# Patient Record
Sex: Male | Born: 1960 | Race: Black or African American | Hispanic: No | Marital: Single | Smoking: Never smoker
Health system: Southern US, Community
[De-identification: ages and names within clinical notes are randomized; demographics above are authoritative.]

---

## 2010-11-27 ENCOUNTER — Inpatient Hospital Stay (HOSPITAL_COMMUNITY)
Admission: EM | Admit: 2010-11-27 | Discharge: 2010-11-28 | Payer: Self-pay | Source: Home / Self Care | Attending: Urology | Admitting: Urology

## 2010-11-27 LAB — DIFFERENTIAL
Basophils Absolute: 0 10*3/uL (ref 0.0–0.1)
Basophils Relative: 0 % (ref 0–1)
Eosinophils Absolute: 0.1 10*3/uL (ref 0.0–0.7)
Eosinophils Relative: 1 % (ref 0–5)
Neutrophils Relative %: 57 % (ref 43–77)

## 2010-11-27 LAB — URINALYSIS, ROUTINE W REFLEX MICROSCOPIC
Nitrite: NEGATIVE
Urobilinogen, UA: 4 mg/dL — ABNORMAL HIGH (ref 0.0–1.0)
pH: 7 (ref 5.0–8.0)

## 2010-11-27 LAB — POCT I-STAT, CHEM 8
Calcium, Ion: 1.12 mmol/L (ref 1.12–1.32)
Chloride: 104 mEq/L (ref 96–112)
Glucose, Bld: 95 mg/dL (ref 70–99)
HCT: 41 % (ref 39.0–52.0)
Hemoglobin: 13.9 g/dL (ref 13.0–17.0)
TCO2: 32 mmol/L (ref 0–100)

## 2010-11-27 LAB — CBC
Platelets: 210 10*3/uL (ref 150–400)
RBC: 4.43 MIL/uL (ref 4.22–5.81)
RDW: 13.5 % (ref 11.5–15.5)
WBC: 9.2 10*3/uL (ref 4.0–10.5)

## 2010-12-20 NOTE — Op Note (Signed)
Seth Townsend, Seth Townsend               ACCOUNT NO.:  [id number]  MEDICAL RECORD NO.:  [id number]          PATIENT TYPE:  INP  LOCATION:  0981                         FACILITY:  [facility]  PHYSICIAN:  Martina Sinner, MD DATE OF BIRTH:  04-20-1961  DATE OF PROCEDURE:  11/27/2010 DATE OF DISCHARGE:                              OPERATIVE REPORT   PREOPERATIVE DIAGNOSIS:  Torsion left testicle.  POSTOPERATIVE DIAGNOSIS:  Torsion left testicle.  SURGERY:  Scrotal exploration; left orchiectomy; right orchiopexy.  Seth Townsend is a 50 year old gentleman who has had pain and swelling of the left hemiscrotum since Wednesday.  Unfortunately, the clinical diagnosis was confirmed with Doppler scrotal ultrasound with no flow to the left testicle and with torsion.  The patient was consented to the above procedure.  Preoperative antibiotics were given.  He had a lot of swelling on the left and the midline incision along the raphe vertically had to be extended to approximately 6 cm.  With good tension on the testicle, I dissected through the thickened, edematous dartos.  A lot of cautery was utilized to control bleeding at each layer.  I delivered an inflamed dark sac and then went a little bit deeper and opened the obvious very thickened tunica vaginalis delivering the testicle that was black and torsed at least 360 degrees.  The tunica vaginalis was impressively thickened opened widely and I resected some of it.  I left the testicle in anatomic position in a warm moist lap pad where I did a right orchiopexy.  Before I did this, hemostasis was excellent on the patient's left side.  Because the incision was quite long and his right testicle was not that large relative to his large scrotum, I was careful to keep the testicle in good anatomic position, again going through dartos layers and the tunica delivering the testicle.  Throughout the orchiopexy, I always kept the testicle with the head of the  epididymis in normal anatomic position.  The testicle was healthy.  I easily opened up the tunica vaginalis widely and made a deep scrotal pouch identifying the thickened midline raphe and the dartos muscle and fascia inferiorly and laterally. With 3 plain sutures, I placed a 4-0 silk on SH applying a hemostat to each suture.  I then dropped the testicle deep in the hemiscrotum in non- tensioned manner, placed the suture to the appropriate area of the testicle at 3, 6, and 12 o'clock.  The 3 o'clock suture was also through the thickened raphe.  I went through the tunica albuginea but not deep into the seminiferous tubules.  Right-sided scrotum was irrigated and the hemostasis was excellent and the testicular lie was excellent.  At this point in time, I directed my attention back to left testicle which was obviously black and necrotic.  I freed up the cord with cautery.  The cord itself was not that thick once I freed up and I could place the hemostat through it, dividing it in 2.  I double hemostat it and removed the testicle with cautery.  Each half of the cord was first suture-ligated with 0 Vicryl followed by two additional  free ties of 0 Vicryl removing the clamp.  There was mild retraction of the stump and no bleeding.  Hemostasis was excellent after irrigation.  I rechecked the right side of the testicle, testicular lie was excellent.  I then closed the thickened dartos from top to bottom with an Allis clamp at each apex.  I picked up dartos, then the midline raphe, and the contralateral side dartos and ran the suture.  I then did an interrupted 3-0 chromic closure trying not the invert his thickened skin.  Hemostasis was excellent.  Telfa fluff dressings and mesh pants was applied.  The patient was covered with antibiotics and taken to the recovery room.  The specimen was sent.          ______________________________ Martina Sinner, MD     SAM/MEDQ  D:  11/27/2010   T:  11/27/2010  Job:  [id number]  Electronically Signed by Alfredo Martinez MD on 12/20/2010 05:16:05 PM

## 2011-06-06 IMAGING — US US ART/VEN ABD/PELV/SCROTUM DOPPLER LTD
1 series · 13 of 25 positions shown · non-contrast
Comparison: None

CLINICAL DATA: Left testicular pain and swelling for 3 days.

SCROTAL ULTRASOUND
DOPPLER ULTRASOUND OF THE TESTICLES
TECHNIQUE: Complete ultrasound examination of the testicles,
epididymis, and other scrotal structures was performed.  Color and
spectral Doppler ultrasound were also utilized to evaluate blood
flow to the testicles.

[Series 1: us art/ven abd/pelv/scrotum doppler ltd · 0.07mm/px · 13 of 72 slices shown]
[im 1/72]
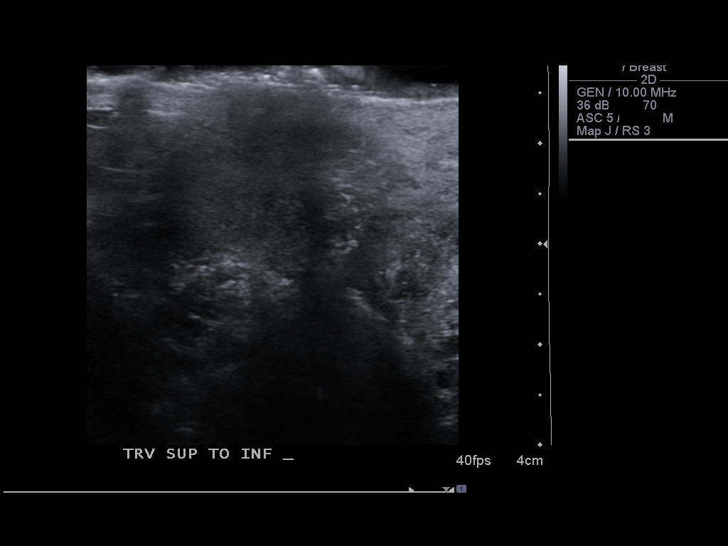
[im 6/72]
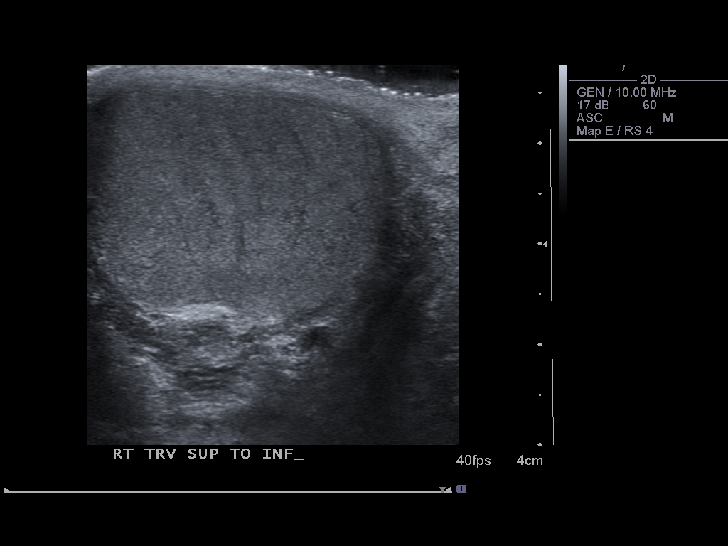
[im 12/72]
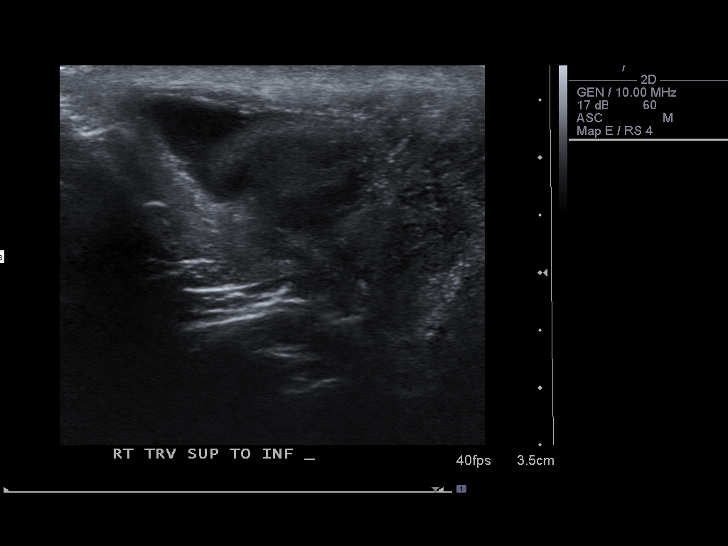
[im 18/72]
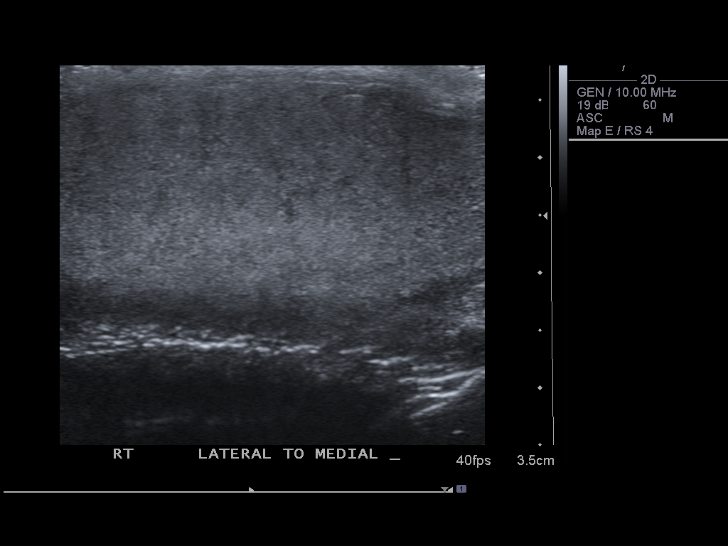
[im 24/72]
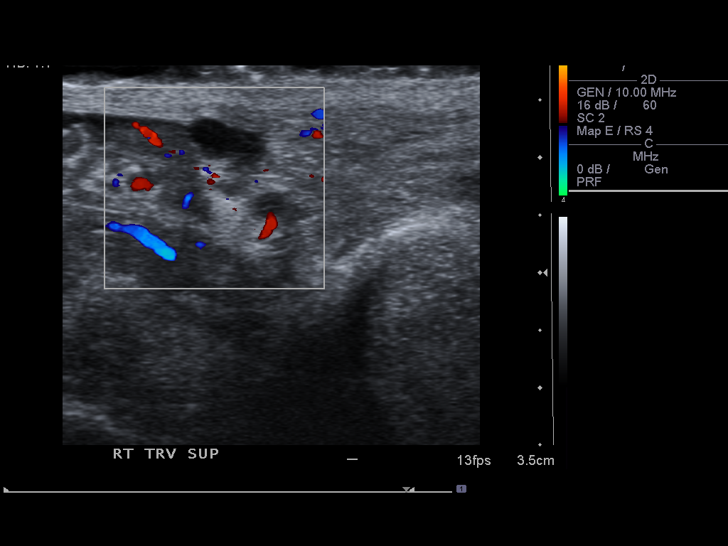
[im 30/72]
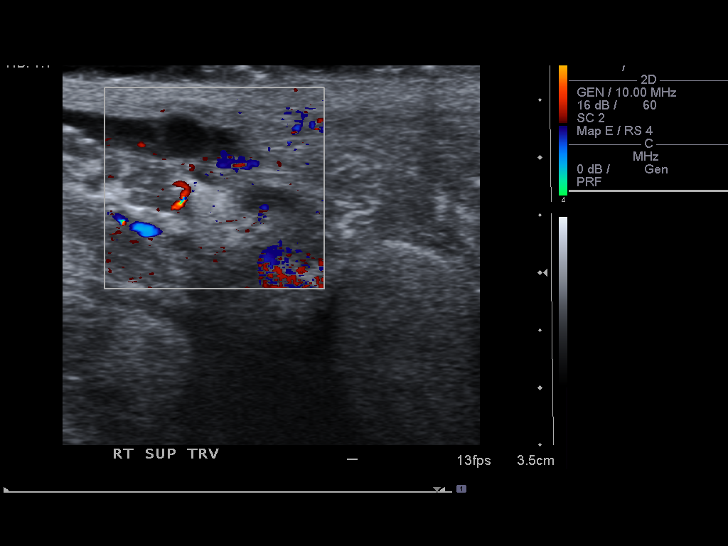
[im 36/72]
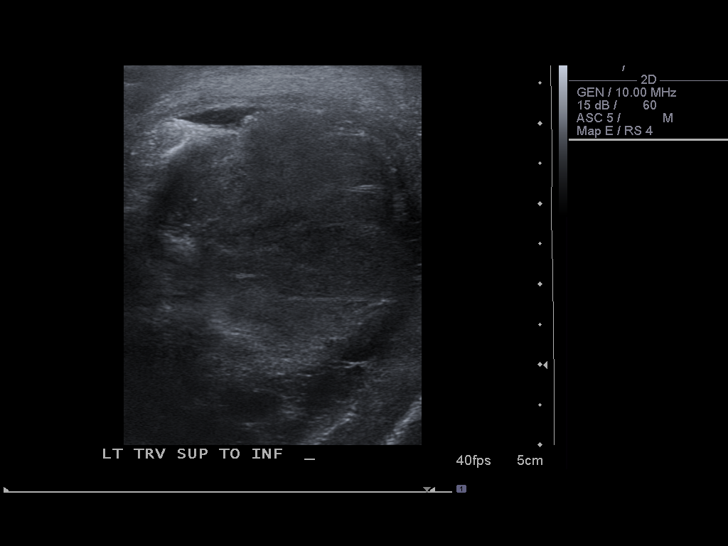
[im 42/72]
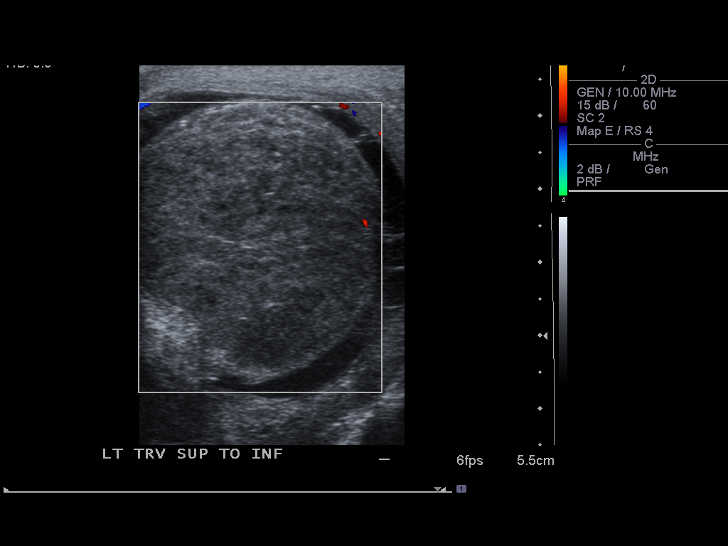
[im 48/72]
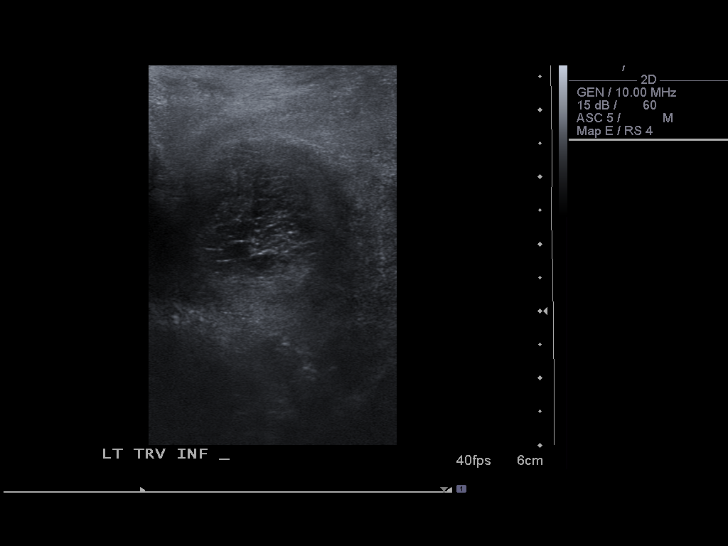
[im 54/72]
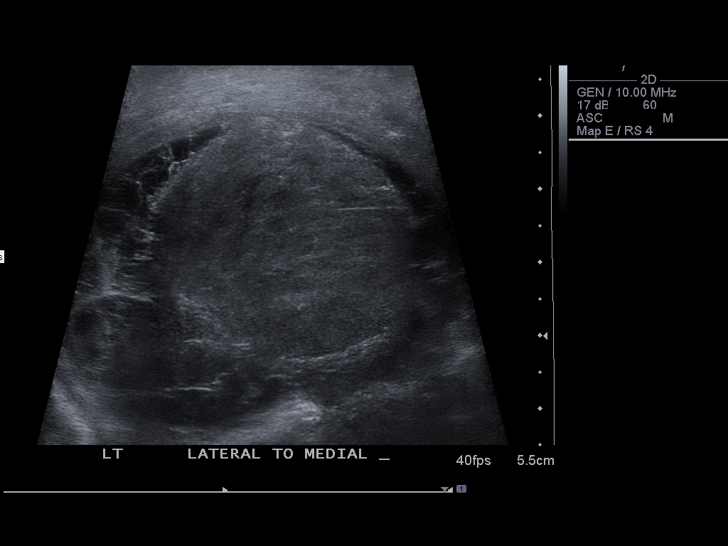
[im 60/72]
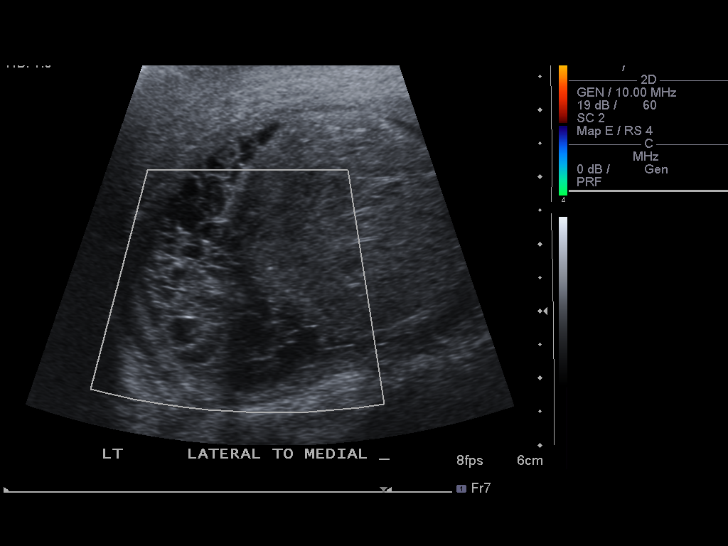
[im 66/72]
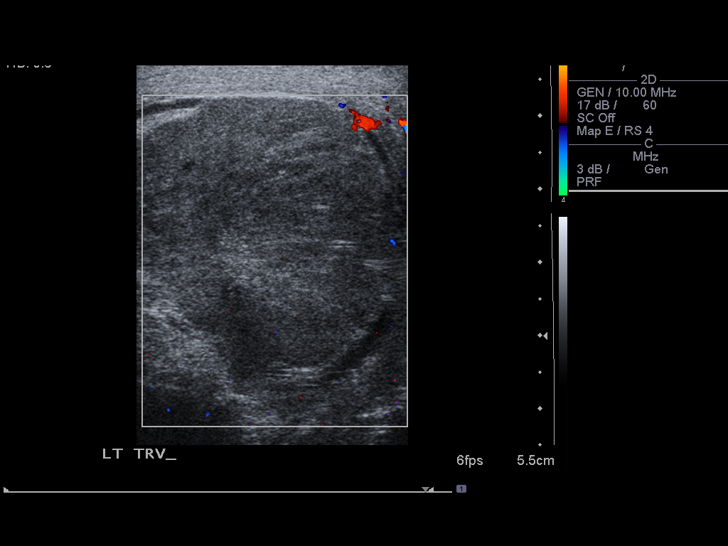
[im 72/72]
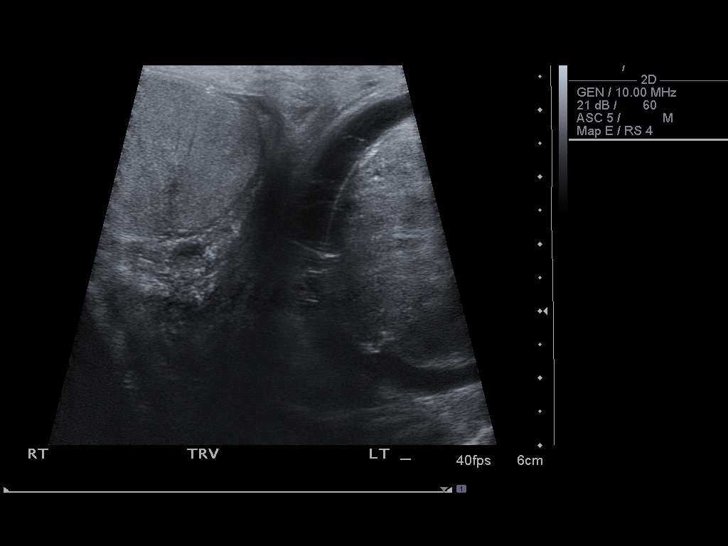

[13 of 25 positions shown; findings below may reference images not displayed]

FINDINGS: The left testicle is enlarged and heterogeneous without
color Doppler flow or arterial/venous waveforms - compatible with
torsion.
Heterogeneous extratesticular cystic areas along the left testicle
probably represents hemorrhage associated with the torsion.

The right testicle is normal in appearance and size.  Normal color
Doppler flow and arterial/venous waveforms within the right
testicle noted.

A small right epididymal cyst is noted.
The left epididymis is heterogeneous and prominent.

A small right hydrocele is present.
There is no evidence of varicocele.
IMPRESSION: Left testicular torsion with associated extratesticular changes.

Normal right testicle.

Small right hydrocele.

Critical test results telephoned to Dr. Ramljak at the time of
interpretation on 11/27/2010 at [DATE].

## 2012-10-01 ENCOUNTER — Ambulatory Visit: Payer: Self-pay | Admitting: Physician Assistant

## 2012-10-01 VITALS — BP 101/69 | HR 79 | Temp 98.0°F | Resp 16 | Ht 72.0 in | Wt 199.4 lb

## 2012-10-01 DIAGNOSIS — Z0289 Encounter for other administrative examinations: Secondary | ICD-10-CM

## 2012-10-01 LAB — POCT URINALYSIS DIPSTICK
Blood, UA: NEGATIVE
Glucose, UA: NEGATIVE
Spec Grav, UA: 1.02

## 2012-10-01 NOTE — Progress Notes (Signed)
  Subjective:    Patient ID: Seth Townsend, male    DOB: 03-08-1961, 51 y.o.   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  He is a healthy man with no known medical problems or daily medications.  He has no other concerns today and does not wish to have any labwork done at this time.      Review of Systems  Constitutional: Negative.   HENT: Negative.   Eyes: Negative.   Respiratory: Negative.   Cardiovascular: Negative.   Gastrointestinal: Negative.   Genitourinary: Negative.   Musculoskeletal: Negative.   Neurological: Negative.   Hematological: Negative.   Psychiatric/Behavioral: Negative.   All other systems reviewed and are negative.       Objective:   Physical Exam  Constitutional: He is oriented to person, place, and time. He appears well-developed and well-nourished.  HENT:  Head: Normocephalic and atraumatic.  Right Ear: Hearing, tympanic membrane, external ear and ear canal normal.  Left Ear: Hearing, tympanic membrane, external ear and ear canal normal.  Mouth/Throat: Uvula is midline, oropharynx is clear and moist and mucous membranes are normal. No oropharyngeal exudate.  Eyes: Conjunctivae normal and EOM are normal. Pupils are equal, round, and reactive to light.  Neck: Normal range of motion. Neck supple. No thyromegaly present.  Cardiovascular: Normal rate, regular rhythm and normal heart sounds.   Pulmonary/Chest: Effort normal and breath sounds normal.  Abdominal: Soft. Bowel sounds are normal. There is no tenderness. There is no rebound and no guarding. Hernia confirmed negative in the right inguinal area and confirmed negative in the left inguinal area.  Musculoskeletal: Normal range of motion.  Lymphadenopathy:    He has no cervical adenopathy.  Neurological: He is alert and oriented to person, place, and time.  Psychiatric: He has a normal mood and affect. His behavior is normal. Judgment and thought content normal.            Assessment & Plan:   1. Physical examination of employee  POCT urinalysis dipstick   2 year card given- forms completed Follow up as needed.

## 2012-11-15 ENCOUNTER — Telehealth: Payer: Self-pay

## 2012-11-15 NOTE — Telephone Encounter (Signed)
Found in scan pile will call patient to pick up copy.

## 2012-11-15 NOTE — Telephone Encounter (Signed)
No answer on patients phone no voicemail to leave message. If patient calls back, its in pick up drawer

## 2012-11-15 NOTE — Telephone Encounter (Signed)
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 PT WAS TOLD IT COULD TAKE UP TO 48HRS

## 2012-11-15 NOTE — Telephone Encounter (Signed)
Not scanned in epic, will search through scan pile tomorrow upstairs or it might be at the scan center to be scanned in....will mark as new to remind myself or next person in medical records.

## 2014-10-05 ENCOUNTER — Ambulatory Visit (INDEPENDENT_AMBULATORY_CARE_PROVIDER_SITE_OTHER): Payer: Self-pay | Admitting: Family Medicine

## 2014-10-05 VITALS — BP 118/64 | HR 93 | Temp 98.5°F | Resp 16 | Ht 72.5 in | Wt 193.2 lb

## 2014-10-05 DIAGNOSIS — Z021 Encounter for pre-employment examination: Secondary | ICD-10-CM

## 2014-10-05 DIAGNOSIS — Z024 Encounter for examination for driving license: Secondary | ICD-10-CM

## 2014-10-05 NOTE — Patient Instructions (Signed)
2 year card given. Follow up with a primary care provider as discussed in next 6 months for regular medical physical.

## 2014-10-05 NOTE — Progress Notes (Signed)
Subjective:    Patient ID: Seth JudeHarvey R Oddo, male    DOB: 03/07/1961, 53 y.o.   MRN: [id number]  This chart was scribed for Shade FloodJeffrey R Brandice Busser,  by Abel PrestoKara Demonbreun, ED Scribe. This patient was seen in room 4 and the patient's care was started at 11:43 AM.   HPI  HPI Comments: Seth JudeHarvey R Hari is a 53 y.o. male who presents to the [facility] for a DOT physical. Last DOT physical was 10/01/12 with no concerning findings at that time and was given a 2 year card.  See medical exam report for DOT. No positive responses on health history. No known chronic medical problems. He does wear corrective lenses to drive.   Pt denies any changes in medical problems since last visit. Pt was last seen by opthamologist 2 years ago. Pt notes he has progressive lenses. Pt denies any glare or floaters while driving at night.  He denies any eye pain or heaviness. Pt notes work has been busy lately. Pt does not have a PCP.      There are no active problems to display for this patient.  History reviewed. No pertinent past medical history. History reviewed. No pertinent past surgical history. No Known Allergies Prior to Admission medications   Not on File   History   Social History  . Marital Status: Single    Spouse Name: N/A    Number of Children: N/A  . Years of Education: N/A   Occupational History  . Not on file.   Social History Main Topics  . Smoking status: Current Every Day Smoker  . Smokeless tobacco: Not on file  . Alcohol Use: Not on file  . Drug Use: Not on file  . Sexual Activity: Not on file   Other Topics Concern  . Not on file   Social History Narrative       Review of Systems  Eyes: Negative for visual disturbance.  All other systems reviewed and are negative.   Negative other than listed above and reviewed DOT form.      Objective:   Physical Exam  Constitutional: He is oriented to person, place, and time. He appears well-developed and well-nourished.   HENT:  Head: Normocephalic and atraumatic.  Right Ear: External ear normal.  Left Ear: External ear normal.  Mouth/Throat: Oropharynx is clear and moist.  Eyes: Conjunctivae and EOM are normal. Pupils are equal, round, and reactive to light.  Neck: Normal range of motion. Neck supple. No JVD present. Carotid bruit is not present. No thyromegaly present.  Cardiovascular: Normal rate, regular rhythm, normal heart sounds and intact distal pulses.   No murmur heard. Pulmonary/Chest: Effort normal and breath sounds normal. No respiratory distress. He has no wheezes. He has no rales.  Abdominal: Soft. He exhibits no distension. There is no tenderness. Hernia confirmed negative in the right inguinal area and confirmed negative in the left inguinal area.  Musculoskeletal: Normal range of motion. He exhibits no edema or tenderness.  Lymphadenopathy:    He has no cervical adenopathy.  Neurological: He is alert and oriented to person, place, and time. He has normal reflexes.  Skin: Skin is warm and dry.  Psychiatric: He has a normal mood and affect. His behavior is normal.  Vitals reviewed.   Filed Vitals:   10/05/14 1103  BP: 118/64  Pulse: 93  Temp: 98.5 F (36.9 C)  TempSrc: Oral  Resp: 16  Height: 6' 0.5" (1.842 m)  Weight: 193  lb 3.2 oz (87.635 kg)  SpO2: 97%     Visual Acuity Screening   Right eye Left eye Both eyes  Without correction:     With correction: 20/20 20/20 20/20   Comments: 85 horizontal both eyes  Hearing Screening Comments: 10 feet pass   Urine testing in office S.G. 1.05, protein negative, blood negative, sugar negative.     Assessment & Plan:  Seth Townsend is a 53 y.o. 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 No concerning findings on exam. Recommended follow up with PCP for regular medical physical in next 6 months. See dot ppwk.   No orders of the defined types were placed in this encounter.     Patient Instructions  2 year card given. Follow up with a primary care provider as discussed in next 6 months for regular medical physical.    I personally performed the services described in this documentation, which was scribed in my presence. The recorded information has been reviewed and considered, and addended by me as needed.

## 2022-08-20 ENCOUNTER — Emergency Department (HOSPITAL_COMMUNITY): Payer: PRIVATE HEALTH INSURANCE

## 2022-08-20 ENCOUNTER — Emergency Department (HOSPITAL_COMMUNITY)
Admission: EM | Admit: 2022-08-20 | Discharge: 2022-08-21 | Disposition: A | Discharge status: Other Acute Inpatient Hospital | Payer: PRIVATE HEALTH INSURANCE | Attending: Emergency Medicine | Admitting: Emergency Medicine

## 2022-08-20 ENCOUNTER — Encounter (HOSPITAL_COMMUNITY): Payer: Self-pay | Admitting: Emergency Medicine

## 2022-08-20 ENCOUNTER — Other Ambulatory Visit: Payer: Self-pay

## 2022-08-20 DIAGNOSIS — F17200 Nicotine dependence, unspecified, uncomplicated: Secondary | ICD-10-CM | POA: Insufficient documentation

## 2022-08-20 DIAGNOSIS — Z20822 Contact with and (suspected) exposure to covid-19: Secondary | ICD-10-CM | POA: Insufficient documentation

## 2022-08-20 DIAGNOSIS — R079 Chest pain, unspecified: Secondary | ICD-10-CM | POA: Diagnosis present

## 2022-08-20 DIAGNOSIS — J930 Spontaneous tension pneumothorax: Secondary | ICD-10-CM

## 2022-08-20 DIAGNOSIS — M549 Dorsalgia, unspecified: Secondary | ICD-10-CM | POA: Diagnosis not present

## 2022-08-20 DIAGNOSIS — R059 Cough, unspecified: Secondary | ICD-10-CM | POA: Insufficient documentation

## 2022-08-20 DIAGNOSIS — R0902 Hypoxemia: Secondary | ICD-10-CM

## 2022-08-20 DIAGNOSIS — R109 Unspecified abdominal pain: Secondary | ICD-10-CM | POA: Insufficient documentation

## 2022-08-20 DIAGNOSIS — R000 Tachycardia, unspecified: Secondary | ICD-10-CM | POA: Insufficient documentation

## 2022-08-20 DIAGNOSIS — K223 Perforation of esophagus: Secondary | ICD-10-CM | POA: Diagnosis not present

## 2022-08-20 DIAGNOSIS — J90 Pleural effusion, not elsewhere classified: Secondary | ICD-10-CM

## 2022-08-20 DIAGNOSIS — R748 Abnormal levels of other serum enzymes: Secondary | ICD-10-CM | POA: Insufficient documentation

## 2022-08-20 DIAGNOSIS — D72829 Elevated white blood cell count, unspecified: Secondary | ICD-10-CM | POA: Diagnosis not present

## 2022-08-20 LAB — I-STAT CHEM 8, ED
BUN: 14 mg/dL (ref 6–20)
Calcium, Ion: 1.13 mmol/L — ABNORMAL LOW (ref 1.15–1.40)
Chloride: 99 mmol/L (ref 98–111)
Creatinine, Ser: 1.4 mg/dL — ABNORMAL HIGH (ref 0.61–1.24)
Glucose, Bld: 193 mg/dL — ABNORMAL HIGH (ref 70–99)
HCT: 47 % (ref 39.0–52.0)
Hemoglobin: 16 g/dL (ref 13.0–17.0)
Potassium: 4.2 mmol/L (ref 3.5–5.1)
Sodium: 140 mmol/L (ref 135–145)
TCO2: 27 mmol/L (ref 22–32)

## 2022-08-20 LAB — COMPREHENSIVE METABOLIC PANEL
ALT: 13 U/L (ref 0–44)
AST: 19 U/L (ref 15–41)
Albumin: 3.9 g/dL (ref 3.5–5.0)
Alkaline Phosphatase: 90 U/L (ref 38–126)
Anion gap: 15 (ref 5–15)
BUN: 11 mg/dL (ref 6–20)
CO2: 26 mmol/L (ref 22–32)
Calcium: 9.5 mg/dL (ref 8.9–10.3)
Chloride: 99 mmol/L (ref 98–111)
Creatinine, Ser: 1.52 mg/dL — ABNORMAL HIGH (ref 0.61–1.24)
GFR, Estimated: 52 mL/min — ABNORMAL LOW (ref >60)
Glucose, Bld: 193 mg/dL — ABNORMAL HIGH (ref 70–99)
Potassium: 3.5 mmol/L (ref 3.5–5.1)
Sodium: 140 mmol/L (ref 135–145)
Total Bilirubin: 0.7 mg/dL (ref 0.3–1.2)
Total Protein: 7.9 g/dL (ref 6.5–8.1)

## 2022-08-20 LAB — CBC WITH DIFFERENTIAL/PLATELET
Abs Immature Granulocytes: 0.04 10*3/uL (ref 0.00–0.07)
Basophils Absolute: 0 10*3/uL (ref 0.0–0.1)
Basophils Relative: 0 %
Eosinophils Absolute: 0 10*3/uL (ref 0.0–0.5)
Eosinophils Relative: 0 %
HCT: 43.7 % (ref 39.0–52.0)
Hemoglobin: 14.6 g/dL (ref 13.0–17.0)
Immature Granulocytes: 0 %
Lymphocytes Relative: 17 %
Lymphs Abs: 2.1 10*3/uL (ref 0.7–4.0)
MCH: 29 pg (ref 26.0–34.0)
MCHC: 33.4 g/dL (ref 30.0–36.0)
MCV: 86.7 fL (ref 80.0–100.0)
Monocytes Absolute: 1 10*3/uL (ref 0.1–1.0)
Monocytes Relative: 8 %
Neutro Abs: 9.5 10*3/uL — ABNORMAL HIGH (ref 1.7–7.7)
Neutrophils Relative %: 75 %
Platelets: 249 10*3/uL (ref 150–400)
RBC: 5.04 MIL/uL (ref 4.22–5.81)
RDW: 13.4 % (ref 11.5–15.5)
WBC: 12.7 10*3/uL — ABNORMAL HIGH (ref 4.0–10.5)
nRBC: 0 % (ref 0.0–0.2)

## 2022-08-20 LAB — TROPONIN I (HIGH SENSITIVITY)
Troponin I (High Sensitivity): 4 ng/L (ref <18)
Troponin I (High Sensitivity): 3 ng/L (ref <18)

## 2022-08-20 LAB — LIPASE, BLOOD: Lipase: 21 U/L (ref 11–51)

## 2022-08-20 LAB — SARS CORONAVIRUS 2 BY RT PCR: SARS Coronavirus 2 by RT PCR: NEGATIVE

## 2022-08-20 MED ORDER — LIDOCAINE HCL (PF) 1 % IJ SOLN
30.0000 mL | Freq: Once | INTRAMUSCULAR | Status: AC
  Administered 2022-08-21: 30 mL
  Filled 2022-08-20: qty 30

## 2022-08-20 MED ORDER — LACTATED RINGERS IV BOLUS
1000.0000 mL | Freq: Once | INTRAVENOUS | Status: AC
  Administered 2022-08-20: 1000 mL via INTRAVENOUS

## 2022-08-20 MED ORDER — SODIUM CHLORIDE 0.9 % IV SOLN
500.0000 mg | Freq: Once | INTRAVENOUS | Status: AC
  Administered 2022-08-21: 500 mg via INTRAVENOUS
  Filled 2022-08-20: qty 5

## 2022-08-20 MED ORDER — FENTANYL CITRATE PF 50 MCG/ML IJ SOSY
50.0000 ug | PREFILLED_SYRINGE | Freq: Once | INTRAMUSCULAR | Status: AC
  Administered 2022-08-20: 50 ug via INTRAVENOUS
  Filled 2022-08-20: qty 1

## 2022-08-20 MED ORDER — SODIUM CHLORIDE 0.9 % IV SOLN
2.0000 g | Freq: Once | INTRAVENOUS | Status: DC
  Filled 2022-08-20: qty 20

## 2022-08-20 MED ORDER — MIDAZOLAM HCL 2 MG/2ML IJ SOLN
1.0000 mg | Freq: Once | INTRAMUSCULAR | Status: AC | PRN
  Administered 2022-08-20: 1 mg via INTRAVENOUS
  Filled 2022-08-20: qty 2

## 2022-08-20 MED ORDER — IOHEXOL 350 MG/ML SOLN
90.0000 mL | Freq: Once | INTRAVENOUS | Status: AC | PRN
  Administered 2022-08-20: 90 mL via INTRAVENOUS

## 2022-08-20 MED ORDER — OXYCODONE-ACETAMINOPHEN 5-325 MG PO TABS
1.0000 | ORAL_TABLET | Freq: Once | ORAL | Status: AC
  Administered 2022-08-20: 1 via ORAL
  Filled 2022-08-20: qty 1

## 2022-08-20 MED ORDER — MORPHINE SULFATE (PF) 4 MG/ML IV SOLN
6.0000 mg | Freq: Once | INTRAVENOUS | Status: DC
  Filled 2022-08-20: qty 2

## 2022-08-20 MED ORDER — PIPERACILLIN-TAZOBACTAM 3.375 G IVPB 30 MIN
3.3750 g | Freq: Once | INTRAVENOUS | Status: AC
  Administered 2022-08-21: 3.375 g via INTRAVENOUS
  Filled 2022-08-20: qty 50

## 2022-08-20 MED ORDER — VANCOMYCIN HCL 1500 MG/300ML IV SOLN
1500.0000 mg | Freq: Once | INTRAVENOUS | Status: DC
  Filled 2022-08-20: qty 300

## 2022-08-20 MED ORDER — ONDANSETRON HCL 4 MG/2ML IJ SOLN
4.0000 mg | Freq: Once | INTRAMUSCULAR | Status: AC
  Administered 2022-08-20: 4 mg via INTRAVENOUS
  Filled 2022-08-20: qty 2

## 2022-08-20 NOTE — ED Notes (Signed)
MD Nogle at bedside  

## 2022-08-20 NOTE — ED Notes (Signed)
Pt back from CT

## 2022-08-20 NOTE — ED Notes (Signed)
Pt verbally consented by MD Nogle prior to chest tube insertion

## 2022-08-20 NOTE — ED Notes (Addendum)
Pt taken to CT with this RN on the monitor

## 2022-08-20 NOTE — ED Notes (Signed)
Pt placed on 6L O2 by MD Nogle d/t pt desatting on room air

## 2022-08-20 NOTE — ED Triage Notes (Signed)
Patient reports left chest pain with generalized abdominal pain , emesis and SOB this evening , he adds left lower back pain , no cough or fever .

## 2022-08-21 ENCOUNTER — Emergency Department (HOSPITAL_COMMUNITY): Payer: PRIVATE HEALTH INSURANCE

## 2022-08-21 DIAGNOSIS — R059 Cough, unspecified: Secondary | ICD-10-CM | POA: Diagnosis not present

## 2022-08-21 DIAGNOSIS — R748 Abnormal levels of other serum enzymes: Secondary | ICD-10-CM | POA: Diagnosis not present

## 2022-08-21 DIAGNOSIS — D72829 Elevated white blood cell count, unspecified: Secondary | ICD-10-CM | POA: Diagnosis not present

## 2022-08-21 DIAGNOSIS — R109 Unspecified abdominal pain: Secondary | ICD-10-CM | POA: Diagnosis not present

## 2022-08-21 DIAGNOSIS — R000 Tachycardia, unspecified: Secondary | ICD-10-CM | POA: Diagnosis not present

## 2022-08-21 DIAGNOSIS — F17200 Nicotine dependence, unspecified, uncomplicated: Secondary | ICD-10-CM | POA: Diagnosis not present

## 2022-08-21 DIAGNOSIS — K223 Perforation of esophagus: Secondary | ICD-10-CM | POA: Diagnosis not present

## 2022-08-21 DIAGNOSIS — M549 Dorsalgia, unspecified: Secondary | ICD-10-CM | POA: Diagnosis not present

## 2022-08-21 DIAGNOSIS — R079 Chest pain, unspecified: Secondary | ICD-10-CM | POA: Diagnosis present

## 2022-08-21 DIAGNOSIS — Z20822 Contact with and (suspected) exposure to covid-19: Secondary | ICD-10-CM | POA: Diagnosis not present

## 2022-08-21 MED ORDER — LACTATED RINGERS IV BOLUS
1000.0000 mL | Freq: Once | INTRAVENOUS | Status: AC
  Administered 2022-08-21: 1000 mL via INTRAVENOUS

## 2022-08-21 MED ORDER — MORPHINE SULFATE (PF) 4 MG/ML IV SOLN
4.0000 mg | Freq: Once | INTRAVENOUS | Status: AC
  Administered 2022-08-21: 4 mg via INTRAVENOUS
  Filled 2022-08-21: qty 1

## 2022-08-21 NOTE — ED Notes (Signed)
EMTALA sheet printed by Network engineer. [facility] report printed and given to [facility].,

## 2022-08-21 NOTE — ED Notes (Signed)
4mg  total wasted morphine with William Hamburger RN

## 2022-08-21 NOTE — ED Notes (Signed)
[facility] here to get patient. Provider filling out EMTALA. Vitals charted. MD to order pain meds prior to pt leaving

## 2022-08-21 NOTE — Discharge Instructions (Addendum)
You are being transferred to [facility] for management of your esophageal rupture.  A chest tube was placed to help manage her symptoms.  Your family was updated.

## 2022-08-21 NOTE — ED Notes (Signed)
Faxed patient facesheet to [phone]. Called CT to have imaging pushed to [facility]

## 2022-08-21 NOTE — ED Notes (Signed)
Pt transferred via [facility] to [facility]

## 2022-08-21 NOTE — ED Provider Notes (Addendum)
[facility] EMERGENCY DEPARTMENT Provider Note   CSN: [id number] Arrival date & time: 08/20/22  1926     History  Chief Complaint  Patient presents with   Chest Pain   Abdominal Pain   Back Pain    Seth Townsend is a 61 y.o. male.   Chest Pain Associated symptoms: abdominal pain and back pain   Abdominal Pain Associated symptoms: chest pain   Back Pain Associated symptoms: abdominal pain and chest pain     Patient is a 61 year old male presents to the emergency department today with cough and chest pain.  Patient reports that he has had URI symptoms for the past 1 to 2 days.  He endorses a stuffy nose and a cough.  Patient states that he vomited 3 times today and had sudden onset chest pain.  He notes that his chest pain has gotten moderately worse.  He is only vomited 3 times not had any additional episodes of vomiting.  He denies any hematemesis or hematochezia.  Patient states that he smokes daily and drinks socially, he denies any additional significant past medical history.  Patient states that he traveled to [place] in November of last year he has not had any additional recent travel.  Patient states that prior to going to [place] he received a tuberculosis vaccine.     Home Medications Prior to Admission medications   Not on File      Allergies    Patient has no known allergies.    Review of Systems   Review of Systems  Cardiovascular:  Positive for chest pain.  Gastrointestinal:  Positive for abdominal pain.  Musculoskeletal:  Positive for back pain.    Physical Exam Updated Vital Signs BP (!) 119/94   Pulse (!) 113   Temp 97.9 F (36.6 C) (Oral)   Resp 20   SpO2 100%  Physical Exam Vitals and nursing note reviewed.  Constitutional:      General: He is not in acute distress.    Appearance: He is well-developed. He is ill-appearing and diaphoretic.     Comments: Afebrile  HENT:     Head: Normocephalic and atraumatic.  Eyes:      Conjunctiva/sclera: Conjunctivae normal.  Cardiovascular:     Rate and Rhythm: Regular rhythm. Tachycardia present.     Heart sounds: No murmur heard. Pulmonary:     Effort: Pulmonary effort is normal. Tachypnea present. No respiratory distress.     Breath sounds: Examination of the left-upper field reveals decreased breath sounds. Examination of the left-middle field reveals decreased breath sounds. Examination of the left-lower field reveals decreased breath sounds. Decreased breath sounds present.  Chest:     Chest wall: Tenderness present.     Comments: Chest tenderness to palpation along left anterior inferior chest wall Abdominal:     Palpations: Abdomen is soft.     Tenderness: There is abdominal tenderness.     Comments: Abdominal distention, significant tenderness palpation diffusely however worse in the epigastric region  Musculoskeletal:        General: No swelling.     Cervical back: Neck supple.     Right lower leg: No tenderness. No edema.     Left lower leg: No tenderness. No edema.  Skin:    General: Skin is warm.     Capillary Refill: Capillary refill takes less than 2 seconds.  Neurological:     Mental Status: He is alert.  Psychiatric:  Mood and Affect: Mood normal.     ED Results / Procedures / Treatments   Labs (all labs ordered are listed, but only abnormal results are displayed) Labs Reviewed  COMPREHENSIVE METABOLIC PANEL - Abnormal; Notable for the following components:      Result Value   Glucose, Bld 193 (*)    Creatinine, Ser 1.52 (*)    GFR, Estimated 52 (*)    All other components within normal limits  CBC WITH DIFFERENTIAL/PLATELET - Abnormal; Notable for the following components:   WBC 12.7 (*)    Neutro Abs 9.5 (*)    All other components within normal limits  I-STAT CHEM 8, ED - Abnormal; Notable for the following components:   Creatinine, Ser 1.40 (*)    Glucose, Bld 193 (*)    Calcium, Ion 1.13 (*)    All other components  within normal limits  SARS CORONAVIRUS 2 BY RT PCR  CULTURE, BLOOD (ROUTINE X 2)  CULTURE, BLOOD (ROUTINE X 2)  LIPASE, BLOOD  QUANTIFERON-TB GOLD PLUS  TROPONIN I (HIGH SENSITIVITY)  TROPONIN I (HIGH SENSITIVITY)    EKG EKG Interpretation  Date/Time:  Saturday August 20 2022 19:52:48 EDT Ventricular Rate:  116 PR Interval:  134 QRS Duration: 96 QT Interval:  342 QTC Calculation: 475 R Axis:   67 Text Interpretation: Sinus tachycardia Right atrial enlargement LVH Borderline ECG When compared with ECG of 27-Nov-2010 17:17, LVH appears to have worsened Confirmed by Vonita MossPaterson, Robert [phone]) on 08/20/2022 9:06:33 PM  Radiology DG Chest Portable 1 View  Result Date: 08/21/2022 CLINICAL DATA:  Status post chest tube placement. EXAM: PORTABLE CHEST 1 VIEW COMPARISON:  Chest CT dated 08/20/2022. FINDINGS: Interval placement of a chest tube with tip in the region of the left apical pleural. Significant decrease in the size of the left pneumothorax. Minimal residual pneumothorax measures approximately 4 mm in thickness. Bibasilar densities left greater right. Stable cardiomediastinal silhouette. No acute osseous pathology. IMPRESSION: Interval placement of a left apical chest tube with significant decrease in the size of the left pneumothorax. Electronically Signed   By: Elgie CollardArash  Radparvar M.D.   On: 08/21/2022 00:27   CT Angio Chest PE W and/or Wo Contrast  Result Date: 08/20/2022 CLINICAL DATA:  Nausea, vomiting, coughing and chest pains with left hydropneumothorax on chest films from today. EXAM: CT ANGIOGRAPHY CHEST CT ABDOMEN AND PELVIS WITH CONTRAST TECHNIQUE: Multidetector CT imaging of the chest was performed using the standard protocol during bolus administration of intravenous contrast. Multiplanar CT image reconstructions and MIPs were obtained to evaluate the vascular anatomy. Multidetector CT imaging of the abdomen and pelvis was performed using the standard protocol during bolus  administration of intravenous contrast. RADIATION DOSE REDUCTION: This exam was performed according to the departmental dose-optimization program which includes automated exposure control, adjustment of the mA and/or kV according to patient size and/or use of iterative reconstruction technique. CONTRAST:  90mL OMNIPAQUE IOHEXOL 350 MG/ML SOLN COMPARISON:  None Available. PA and lateral chest today at 8:10 p.m., portable chest today at 9:16 p.m. are the only relevant prior studies. FINDINGS: Cardiovascular: The pulmonary arteries are normal caliber and free of thrombus at least through the segmental divisions. The subsegmental arterial bed is unopacified due to bolus timing and otherwise largely obscured due to respiratory motion, and on the left due to hydropneumothorax. There is mild cardiomegaly but no significant pericardial effusion. There is mild aortic and great vessel atherosclerosis without aneurysm, stenosis or dissection. There are no visible coronary artery calcific plaques.  Mediastinum/Nodes: There is moderate subcarinal pneumomediastinum along side the esophagus most likely due to a distal esophageal rupture. The exact location of the rupture is not identified. Some of the abnormal air in the area could be in the submucosa of the esophagus itself but most of it is free in the surrounding mediastinal space. The upper half of the thoracic esophagus is unremarkable. There is no appreciable thyroid mass. Axillary spaces are clear and no intrathoracic adenopathy is seen. The mediastinum is shifted mildly to the right which is most likely related to left tension pneumothorax. The trachea and main bronchi are clear. Lungs/Pleura: There is a left hydropneumothorax, with moderate posteriorly layering homogeneous low-density hydro component without visible loculation or complexity of the fluid or pleural enhancement or thickening. The pneumo component anteriorly extends from the level of the manubrium sternum all  the way to the chest base and is estimated at at least 30% chest volume, as above with mediastinal shift. The upper lobes are mildly emphysematous with paraseptal changes predominating. There are patchy opacities of the partially collapsed left lung, nonspecific. There is no pneumothorax on the right, but there is a small right pleural effusion in the posterior chest base with rounded medial contour suggesting a small loculated effusion or partially loculated collection. There is patchy haziness in the right lower lobe which could be due to atelectasis, pneumonia or aspiration. The remaining right lung is generally clear. 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 No acute or other significant osseous findings. The ribs are intact. Review of the MIP images confirms the above findings. CT ABDOMEN and PELVIS FINDINGS Hepatobiliary: No focal liver abnormality is seen. No calcified gallstones, gallbladder wall thickening, or biliary dilatation. There is mild hepatic steatosis. Pancreas: Unremarkable. No pancreatic ductal dilatation or surrounding inflammatory changes. Spleen: Normal. Adrenals/Urinary Tract: No adrenal or renal mass is seen. There is no urinary stone or obstruction. The bladder is contracted and not well evaluated. Stomach/Bowel: The paraesophageal free air in the lower mediastinum does not extend into the abdomen below the hiatus. The stomach is moderate to severely fluid distended but there is no outlet obstructing mass or duodenal thickening. No bowel dilatation or wall thickening is seen including the appendix, with somewhat limited intestinal assessment due to lack of enteric contrast. Vascular/Lymphatic: Aortic atherosclerosis. No enlarged abdominal or pelvic lymph nodes. There are few borderline sized lymph nodes along the gastrohepatic ligament up to 8 mm in short axis. Reproductive: Prostate is unremarkable. Other:  No abdominal or pelvic free air, hemorrhage or free fluid. There is no incarcerated hernia. In the low anterior pelvic wall right of midline there is a 1.3 cm subcutaneous cystic lesion. Musculoskeletal: No acute or significant osseous findings. IMPRESSION: 1. Left tension hydropneumothorax with least a 30% anterior pneumo component, moderate layering left hydro component. 2. Subcarinal pneumomediastinum alongside the esophagus most likely due to an esophageal rupture. The location of the tear is not identified. The mediastinal air does not extend into the abdomen. 3. Small at least partially loculated posterior basal right pleural effusion. No right pneumothorax. 4. Mild cardiomegaly. No pulmonary arterial dilatation or embolus through the segmental divisions with the subsegmental arteries not evaluated on this exam. 5. Emphysema, with nonspecific patchy opacities in the partially collapsed left lung, ground-glass opacities in the right lower lobe which could be atelectasis, pneumonia or aspiration. 6. Aortic atherosclerosis. 7. Moderate to severe gastric fluid dilatation. No outlet obstructing mass. 8. 1.3 cm subcutaneous cystic  abnormality in the low anterior pelvic wall right of midline just above the level of the pubic bone. 9. Mild hepatic steatosis. 10. Discussed over the phone with Dr. Cyndee Brightly at 10:42 p.m., 08/20/2022, with verbal acknowledgement of the key findings. Electronically Signed   By: Almira Bar M.D.   On: 08/20/2022 23:17   CT ABDOMEN PELVIS W CONTRAST  Result Date: 08/20/2022 CLINICAL DATA:  Nausea, vomiting, coughing and chest pains with left hydropneumothorax on chest films from today. EXAM: CT ANGIOGRAPHY CHEST CT ABDOMEN AND PELVIS WITH CONTRAST TECHNIQUE: Multidetector CT imaging of the chest was performed using the standard protocol during bolus administration of intravenous contrast. Multiplanar CT image reconstructions and MIPs were obtained to evaluate the vascular anatomy.  Multidetector CT imaging of the abdomen and pelvis was performed using the standard protocol during bolus administration of intravenous contrast. RADIATION DOSE REDUCTION: This exam was performed according to the departmental dose-optimization program which includes automated exposure control, adjustment of the mA and/or kV according to patient size and/or use of iterative reconstruction technique. CONTRAST:  90mL OMNIPAQUE IOHEXOL 350 MG/ML SOLN COMPARISON:  None Available. PA and lateral chest today at 8:10 p.m., portable chest today at 9:16 p.m. are the only relevant prior studies. FINDINGS: Cardiovascular: The pulmonary arteries are normal caliber and free of thrombus at least through the segmental divisions. The subsegmental arterial bed is unopacified due to bolus timing and otherwise largely obscured due to respiratory motion, and on the left due to hydropneumothorax. There is mild cardiomegaly but no significant pericardial effusion. There is mild aortic and great vessel atherosclerosis without aneurysm, stenosis or dissection. There are no visible coronary artery calcific plaques. Mediastinum/Nodes: There is moderate subcarinal pneumomediastinum along side the esophagus most likely due to a distal esophageal rupture. The exact location of the rupture is not identified. Some of the abnormal air in the area could be in the submucosa of the esophagus itself but most of it is free in the surrounding mediastinal space. The upper half of the thoracic esophagus is unremarkable. There is no appreciable thyroid mass. Axillary spaces are clear and no intrathoracic adenopathy is seen. The mediastinum is shifted mildly to the right which is most likely related to left tension pneumothorax. The trachea and main bronchi are clear. Lungs/Pleura: There is a left hydropneumothorax, with moderate posteriorly layering homogeneous low-density hydro component without visible loculation or complexity of the fluid or pleural  enhancement or thickening. The pneumo component anteriorly extends from the level of the manubrium sternum all the way to the chest base and is estimated at at least 30% chest volume, as above with mediastinal shift. The upper lobes are mildly emphysematous with paraseptal changes predominating. There are patchy opacities of the partially collapsed left lung, nonspecific. There is no pneumothorax on the right, but there is a small right pleural effusion in the posterior chest base with rounded medial contour suggesting a small loculated effusion or partially loculated collection. There is patchy haziness in the right lower lobe which could be due to atelectasis, pneumonia or aspiration. The remaining right lung is generally clear. 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 No acute or other significant osseous findings. The ribs are intact. Review of the MIP images confirms the above findings. CT ABDOMEN and PELVIS FINDINGS Hepatobiliary: No focal liver abnormality is seen. No calcified gallstones, gallbladder wall thickening, or biliary dilatation. There is mild hepatic steatosis. Pancreas: Unremarkable. No pancreatic ductal dilatation or surrounding inflammatory changes.  Spleen: Normal. Adrenals/Urinary Tract: No adrenal or renal mass is seen. There is no urinary stone or obstruction. The bladder is contracted and not well evaluated. Stomach/Bowel: The paraesophageal free air in the lower mediastinum does not extend into the abdomen below the hiatus. The stomach is moderate to severely fluid distended but there is no outlet obstructing mass or duodenal thickening. No bowel dilatation or wall thickening is seen including the appendix, with somewhat limited intestinal assessment due to lack of enteric contrast. Vascular/Lymphatic: Aortic atherosclerosis. No enlarged abdominal or pelvic lymph nodes. There are few borderline sized lymph  nodes along the gastrohepatic ligament up to 8 mm in short axis. Reproductive: Prostate is unremarkable. Other: No abdominal or pelvic free air, hemorrhage or free fluid. There is no incarcerated hernia. In the low anterior pelvic wall right of midline there is a 1.3 cm subcutaneous cystic lesion. Musculoskeletal: No acute or significant osseous findings. IMPRESSION: 1. Left tension hydropneumothorax with least a 30% anterior pneumo component, moderate layering left hydro component. 2. Subcarinal pneumomediastinum alongside the esophagus most likely due to an esophageal rupture. The location of the tear is not identified. The mediastinal air does not extend into the abdomen. 3. Small at least partially loculated posterior basal right pleural effusion. No right pneumothorax. 4. Mild cardiomegaly. No pulmonary arterial dilatation or embolus through the segmental divisions with the subsegmental arteries not evaluated on this exam. 5. Emphysema, with nonspecific patchy opacities in the partially collapsed left lung, ground-glass opacities in the right lower lobe which could be atelectasis, pneumonia or aspiration. 6. Aortic atherosclerosis. 7. Moderate to severe gastric fluid dilatation. No outlet obstructing mass. 8. 1.3 cm subcutaneous cystic abnormality in the low anterior pelvic wall right of midline just above the level of the pubic bone. 9. Mild hepatic steatosis. 10. Discussed over the phone with Dr. Cyndee Brightly at 10:42 p.m., 08/20/2022, with verbal acknowledgement of the key findings. Electronically Signed   By: Almira Bar M.D.   On: 08/20/2022 23:17   DG CHEST PORT 1 VIEW  Result Date: 08/20/2022 CLINICAL DATA:  Chest pain EXAM: PORTABLE CHEST 1 VIEW COMPARISON:  8:10 p.m. FINDINGS: Moderate left hydropneumothorax appears slightly enlarged since prior examination with enlarging apicolateral gaseous component. No radiographic evidence of tension physiology. Moderate fluid component. Superimposed left basilar  collapse and consolidation again noted. Right lung is clear. No pneumothorax or pleural effusion on the right. Cardiac size within normal limits. IMPRESSION: Slight interval enlargement of moderate left hydropneumothorax. No radiographic evidence of tension physiology. Electronically Signed   By: Helyn Numbers M.D.   On: 08/20/2022 21:27   DG Chest 2 View  Result Date: 08/20/2022 CLINICAL DATA:  Chest pain. EXAM: CHEST - 2 VIEW COMPARISON:  None Available. FINDINGS: The heart size and mediastinal contours are within normal limits. Right lung is clear. Left lingular opacity is noted consistent with pneumonia. Small left apical pneumothorax is noted. The visualized skeletal structures are unremarkable. IMPRESSION: Small left apical pneumothorax. Left lingular opacity consistent with pneumonia. Possible small left pleural effusion. Critical Value/emergent results were called by telephone at the time of interpretation on 08/20/2022 at 8:25 pm to provider Dr. Glennis Brink, who verbally acknowledged these results. Electronically Signed   By: Lupita Raider M.D.   On: 08/20/2022 20:25    Procedures CHEST TUBE INSERTION  Date/Time: 08/21/2022 12:28 AM  Performed by: Zamzam Whinery, [place], MD Authorized by: Rondel Baton, MD   Consent:    Consent obtained:  Written   Consent given by:  Patient  Risks, benefits, and alternatives were discussed: yes     Risks discussed:  Bleeding, damage to surrounding structures, incomplete drainage, infection, nerve damage and pain   Alternatives discussed:  Delayed treatment and alternative treatment Universal protocol:    Procedure explained and questions answered to patient or proxy's satisfaction: yes     Relevant documents present and verified: yes     Test results available: yes     Imaging studies available: yes     Required blood products, implants, devices, and special equipment available: yes     Site/side marked: yes     Immediately prior to procedure, a  time out was called: yes     Patient identity confirmed:  Arm band Pre-procedure details:    Skin preparation:  Chlorhexidine with alcohol and chlorhexidine   Preparation: Patient was prepped and draped in the usual sterile fashion   Sedation:    Sedation type:  Anxiolysis Anesthesia:    Anesthesia method:  Local infiltration   Local anesthetic:  Lidocaine 1% w/o epi Procedure details:    Placement location:  L lateral   Scalpel size:  11   Tube size (Fr):  28   Dissection instrument:  Kelly clamp and finger   Ultrasound guidance: no     Tension pneumothorax: yes     Tube connected to:  Suction   Drainage characteristics: Air, gastric contents.   Suture material:  2-0 silk   Dressing:  4x4 sterile gauze Post-procedure details:    Post-insertion x-ray findings: tube in good position     Procedure completion:  Tolerated     Medications Ordered in ED Medications  azithromycin (ZITHROMAX) 500 mg in sodium chloride 0.9 % 250 mL IVPB (500 mg Intravenous New Bag/Given 08/21/22 0026)  piperacillin-tazobactam (ZOSYN) IVPB 3.375 g (3.375 g Intravenous New Bag/Given 08/21/22 0023)  vancomycin (VANCOREADY) IVPB 1500 mg/300 mL (has no administration in time range)  morphine (PF) 4 MG/ML injection 6 mg (6 mg Intravenous Not Given 08/21/22 0017)  oxyCODONE-acetaminophen (PERCOCET/ROXICET) 5-325 MG per tablet 1 tablet (1 tablet Oral Given 08/20/22 2007)  ondansetron (ZOFRAN) injection 4 mg (4 mg Intravenous Given 08/20/22 2119)  fentaNYL (SUBLIMAZE) injection 50 mcg (50 mcg Intravenous Given 08/20/22 2119)  lactated ringers bolus 1,000 mL (0 mLs Intravenous Stopped 08/20/22 2330)  lidocaine (PF) (XYLOCAINE) 1 % injection 30 mL (30 mLs Infiltration Given 08/21/22 0017)  iohexol (OMNIPAQUE) 350 MG/ML injection 90 mL (90 mLs Intravenous Contrast Given 08/20/22 2234)  lactated ringers bolus 1,000 mL (0 mLs Intravenous Stopped 08/21/22 0030)  midazolam (VERSED) injection 1 mg (1 mg Intravenous  Given 08/20/22 2335)    ED Course/ Medical Decision Making/ A&P                          Medical Decision Making Problems Addressed: Esophageal rupture: complicated acute illness or injury with systemic symptoms that poses a threat to life or bodily functions  Amount and/or Complexity of Data Reviewed Independent Historian: parent Labs:  Decision-making details documented in ED Course. Radiology: ordered. Decision-making details documented in ED Course. ECG/medicine tests:  Decision-making details documented in ED Course.  Risk Prescription drug management. Decision regarding hospitalization. Emergency major surgery.  Critical Care Total time providing critical care: 45 minutes   Medical Decision Making  This patient is Presenting for Evaluation of chest pain, patient has no significant past medical history.  Of which  require a range of treatment options, and does is a complaint that  involves a high risk of morbidity and mortality.  Arrived in ED by: POV History obtained from: The patient, the patient's wife Limitations in history: none    At this time I am most concerned for esophageal perforation. Also considering pneumothorax, tension pneumothorax, pneumonia, empyema, hemothorax, bacteremia. Plan for laboratory and imaging study work-up   EKG: I interpreted the ECG. It reveals a sinus tachycardia. The QTc, PR, and QRS are appropriate. There are no signs of acute ischemia or of significant electrical abnormalities. The ECG does not show a STEMI. There are no ST depressions. There are no T wave inversions. There is no evidence of a High-Grade Conduction Block.  LVH   Laboratory work-up significant for:  -CBC with white count 12.7 with left shift.  No additional significant hematologic abnormalities. -CMP reveals elevated creatinine to 1.52, no additional significant metabolic abnormality -Lipase 21 - Initial troponin 4, repeat 3   Radiologic work-up was significant  for: -Initial chest x-ray with small left-sided apical pneumothorax small left pleural effusion -Chest x-ray after clinical decline approximately 1 after after the initial, revealed increase in size of the left apical pneumothorax with moderate hydropneumothorax -CT chest, neck, pelvis revealed left-sided tension hydropneumothorax with approximately 30 percent  anterior pneumothorax.  Concern for pneumomediastinum with associated esophageal rupture.  Location of the tear unclear.  Small partially loculated posterior right pleural effusion with no pneumothorax.  Emphysematous changes of the lung.  Severe gastric fluid dilation.   Interventions and Interval History: -On arrival to the emergency department, patient was tachycardic to low 100s but hemodynamically stable, having no oxygen requirement, only small mount of pain.  On my repeat assessment, patient had desaturated to approximately 88%.  Patient required 6 L nasal cannula supplemental oxygen to maintain oxygen saturations appropriate level.  Patient became progressively more tachycardic however he remained normotensive.  Repeat chest x-ray revealed progression of the pneumothorax and there is evidence of worsening pleural effusion.  I had a high clinical concern for esophageal perforation due to the patient's recent episodes of vomiting and sudden onset of chest pain shortly after the vomiting.  Also I concern the patient was developing tension physiology.   -CT scan was obtained emergently which revealed evidence of esophageal perforation.  Discussed the case with CT surgery at [facility] however the [place] surgeon on-call, Dr. Tenny Craw, felt that the patient required higher level care at a tertiary center deferred for outpatient placement.  [facility] was unable to except the patient.  Spoke with Dr. Elenor Quinones at [facility] with cardiothoracic surgery who accepted the patient for transfer. -Clinical concern as well as imaging confirmation for tension physiology  patient. Emergency 28 French large-bore chest tube placed in the left chest without complication, please see procedure note for details.  Following placement of the chest tube, patient had approximate 1 L of output of gastric content from the tube.  Patient's hemodynamics improved. -Chest x-ray confirmed chest tube placement.  Significant reduction in the size of the pneumothorax. -Patient treated empirically with broad-spectrum antibiotics of Zosyn and vancomycin.  Patient received 2 L fluid bolus     Disposition: Due to the patients current presenting symptoms, physical exam findings, and the workup stated above, it is thought that the etiology of the patients current presentation is esophageal rupture with associated tension pneumothorax    ADMIT: Patient is thought to require admission for operative management. Patient will be transferred to [facility] for further operative management.  Patient was accepted by Dr.D'Amico CT surgery.  Please see in patient provider note for additional treatment plan details.    The plan for this patient was discussed with Dr. Eloise Harman, who voiced agreement and who oversaw evaluation and treatment of this patient.     Clinical Complexity  A medically appropriate history, review of systems, and physical exam was performed.   I personally reviewed the lab and imaging studies discussed above.   MDM generated using voice dictation software and may contain dictation errors. Please contact me for any clarification or with any questions.            Final Clinical Impression(s) / ED Diagnoses Final diagnoses:  Esophageal rupture    Rx / DC Orders ED Discharge Orders     None         Seth Townsend, [place], MD 08/21/22 [id number]    Patty Lopezgarcia, [place], MD 08/21/22 1505    Rondel Baton, MD 08/21/22 [phone]

## 2022-08-21 NOTE — ED Notes (Addendum)
Trauma Event Note  Called to bedside to assist with chest tube insertion. Pt sedated with versed, on cardiac monitor, 6L [place] and EtCO2. 28Fr chest tube placed in left chest with immediate return of gastric contents. [place] applied at -20, hooked to wall suction. Tube secured with sutures by MD, dressing applied. Pending XRAY for tube placement confirmation. POC transfer to [facility] center. Pt tolerated procedure well, improvement of HR post procedure.  Last imported Vital Signs BP 111/89 (BP Location: Left Arm)   Pulse (!) 113   Temp 97.7 F (36.5 C) (Oral)   Resp (!) 32   SpO2 98%   Trending CBC Recent Labs    08/20/22 2018 08/20/22 2210  WBC 12.7*  --   HGB 14.6 16.0  HCT 43.7 47.0  PLT 249  --     Trending Coag's No results for input(s): "APTT", "INR" in the last 72 hours.  Trending BMET Recent Labs    08/20/22 2018 08/20/22 2210  NA 140 140  K 3.5 4.2  CL 99 99  CO2 26  --   BUN 11 14  CREATININE 1.52* 1.40*  GLUCOSE 193* 193*      Saheed Carrington O Laneka Mcgrory  Trauma Response RN  Please call TRN at [phone] for further assistance.

## 2022-08-21 NOTE — ED Notes (Signed)
Report called to Doran Durand RN at [facility]

## 2022-08-24 LAB — QUANTIFERON-TB GOLD PLUS (RQFGPL)
QuantiFERON Mitogen Value: 0.42 IU/mL
QuantiFERON Nil Value: 0.09 IU/mL
QuantiFERON TB1 Ag Value: 0.08 IU/mL
QuantiFERON TB2 Ag Value: 0.12 IU/mL

## 2022-08-24 LAB — QUANTIFERON-TB GOLD PLUS: QuantiFERON-TB Gold Plus: UNDETERMINED — AB

## 2022-08-25 LAB — CULTURE, BLOOD (ROUTINE X 2)
Culture: NO GROWTH
Culture: NO GROWTH
Special Requests: ADEQUATE
Special Requests: ADEQUATE

## 2022-08-29 MED FILL — Fentanyl Citrate Preservative Free (PF) Inj 100 MCG/2ML: INTRAMUSCULAR | Qty: 2 | Status: AC
